# Patient Record
Sex: Male | Born: 1995 | Race: Black or African American | Hispanic: No | Marital: Single | Smoking: Never smoker
Health system: Southern US, Community
[De-identification: ages and names within clinical notes are randomized; demographics above are authoritative.]

---
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
## 2016-04-30 IMAGING — US US SCROTUM
1 series · 13 of 25 positions shown · non-contrast
Comparison: None

CLINICAL DATA: RIGHT testicular pain swelling since 4944 hours

EXAM:
SCROTAL ULTRASOUND
DOPPLER ULTRASOUND OF THE TESTICLES
TECHNIQUE: Complete ultrasound examination of the testicles, epididymis, and
other scrotal structures was performed. Color and spectral Doppler
ultrasound were also utilized to evaluate blood flow to the
testicles.

[Series 1: us scrotum · 0.07mm/px · 13 of 66 slices shown]
[im 1/66]
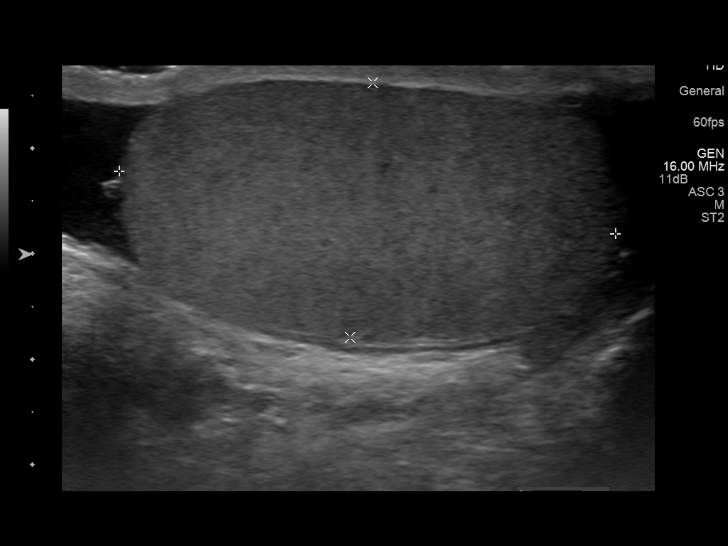
[im 6/66]
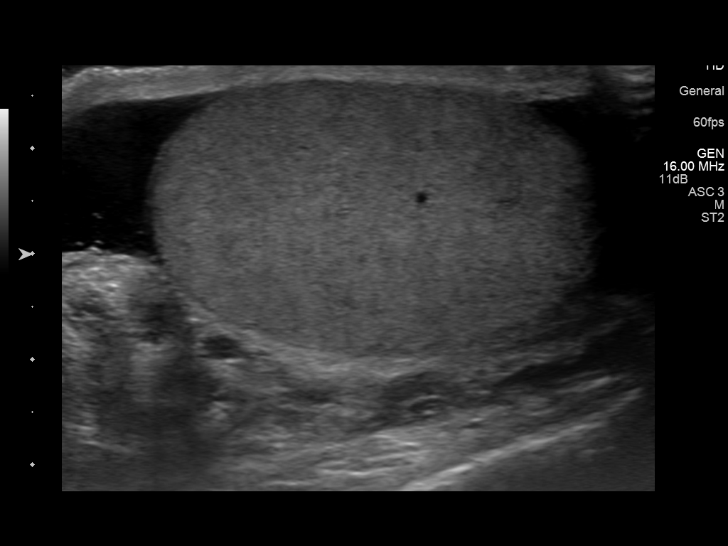
[im 11/66]
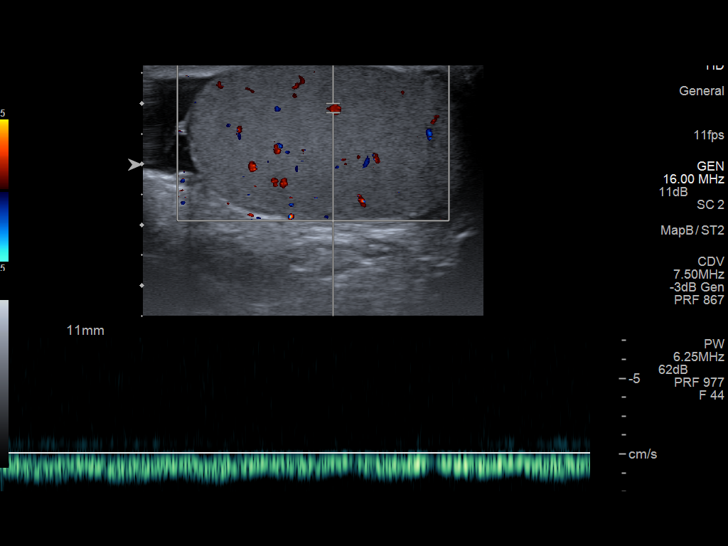
[im 17/66]
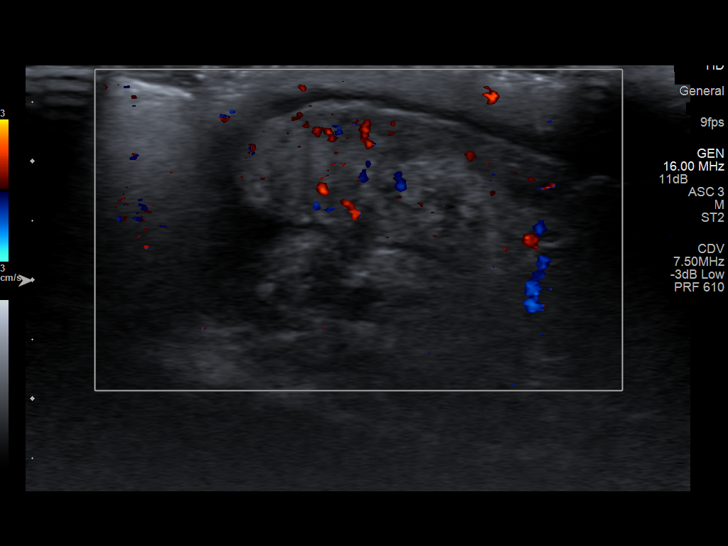
[im 22/66]
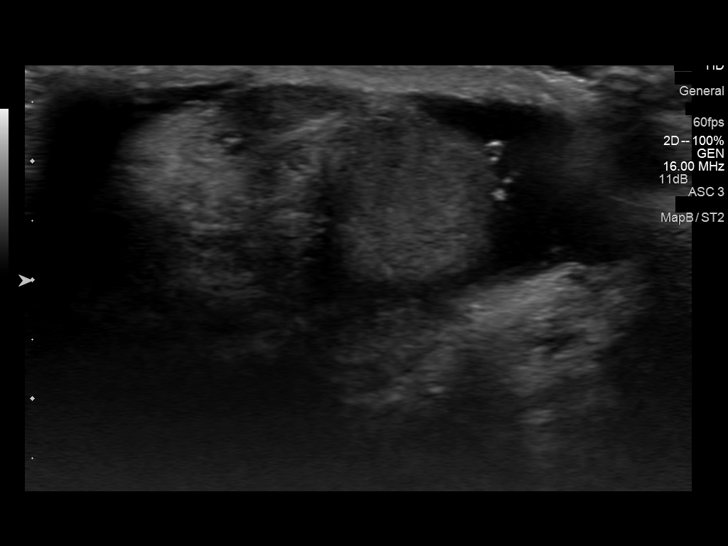
[im 28/66]
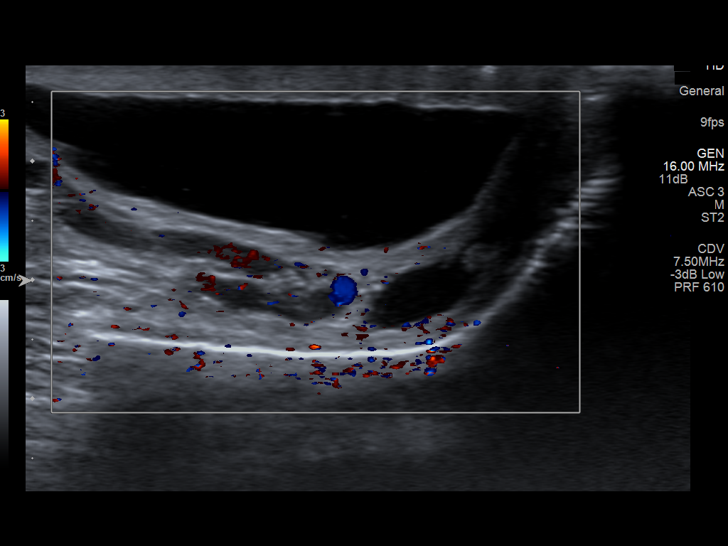
[im 33/66]
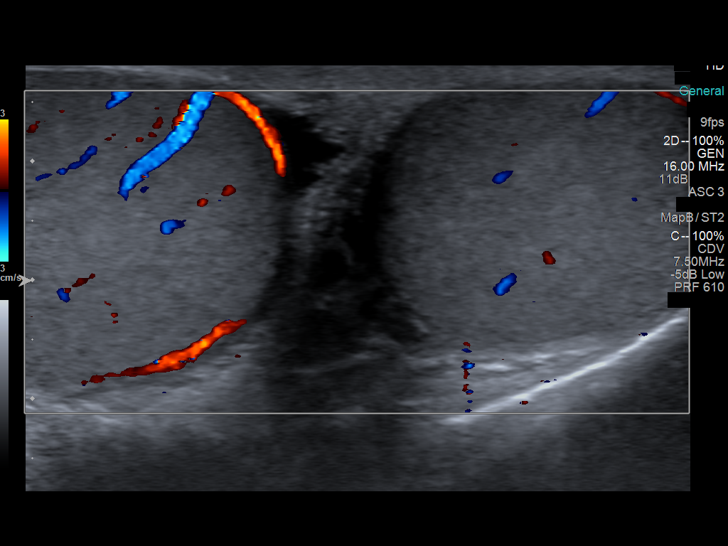
[im 38/66]
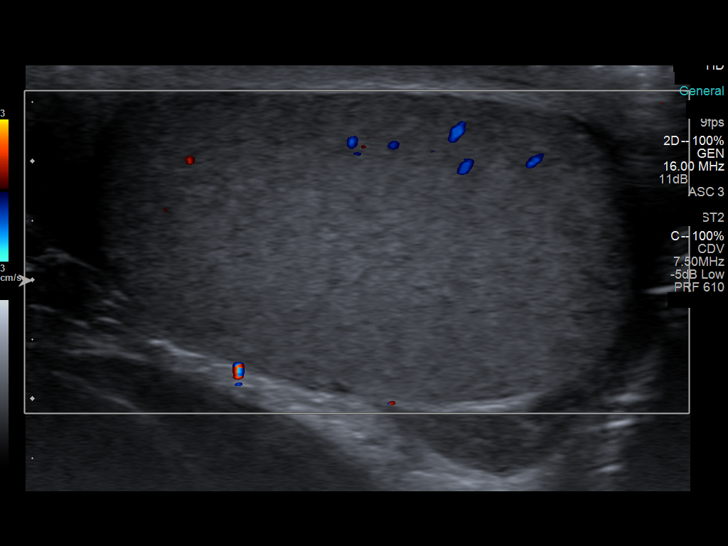
[im 44/66]
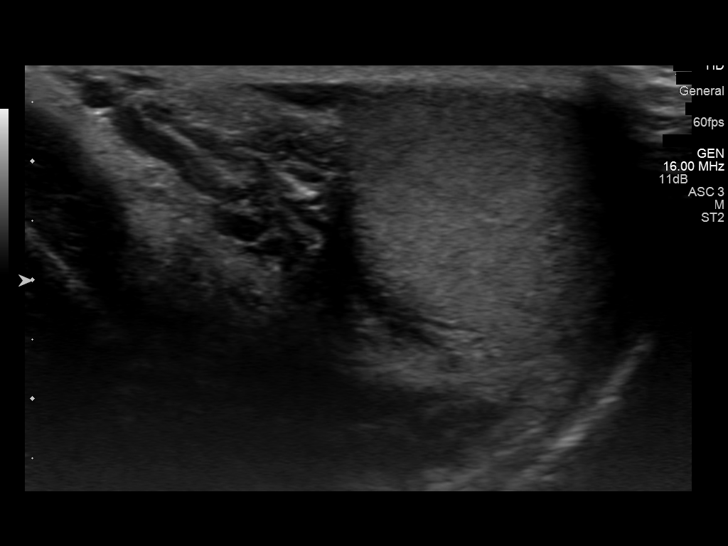
[im 49/66]
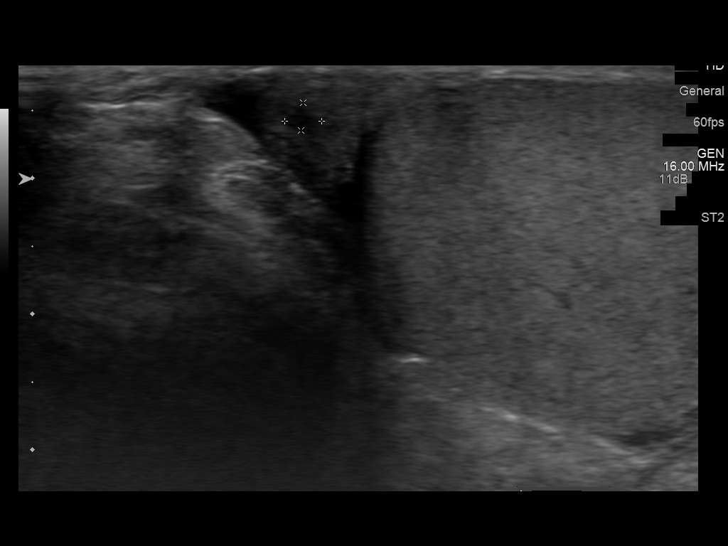
[im 55/66]
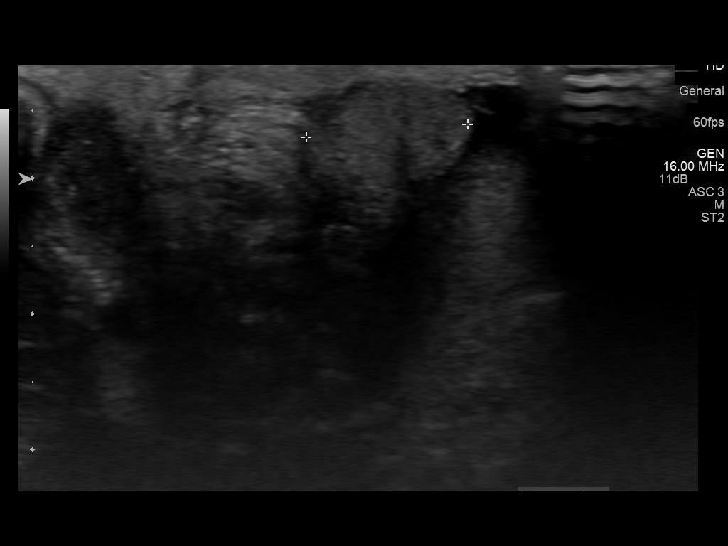
[im 60/66]
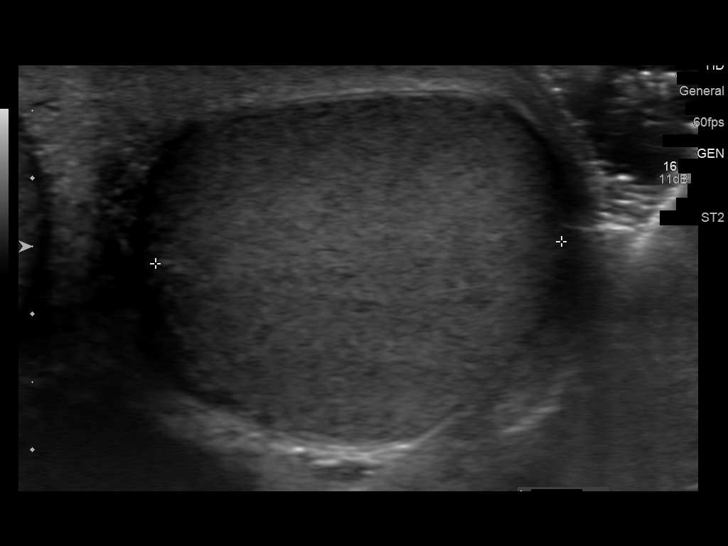
[im 66/66]
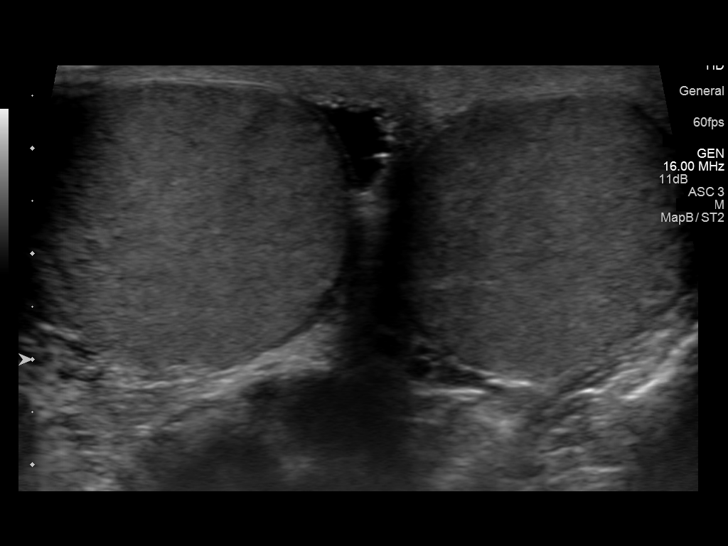

[13 of 25 positions shown; findings below may reference images not displayed]

FINDINGS: Right testicle

Measurements: 4.7 x 2.4 x 3.2 cm. Normal morphology without mass or
calcification. Internal blood flow present on color Doppler imaging,
greater than seen on LEFT.

Left testicle

Measurements: 4.8 x 2.5 x 3.0 cm. Normal morphology without mass or
calcification. Internal blood flow present on color Doppler imaging.

Right epididymis: Mildly hypervascular and slightly heterogeneous
without mass.

Left epididymis:  Tiny cyst at head 3 x 2 x 2 mm.

Hydrocele:  Small BILATERAL hydroceles greater on RIGHT.

Varicocele:  Absent bilaterally

Pulsed Doppler interrogation of both testes demonstrates normal low
resistance arterial and venous waveforms bilaterally.
IMPRESSION: Hypervascularity of RIGHT testis and epididymis versus LEFT
compatible with epididymo-orchitis of the RIGHT testis.

No evidence of testicular mass or torsion.

Small BILATERAL hydroceles greater on RIGHT.

## 2019-02-12 ENCOUNTER — Other Ambulatory Visit: Payer: Self-pay

## 2019-02-12 DIAGNOSIS — Z20822 Contact with and (suspected) exposure to covid-19: Secondary | ICD-10-CM

## 2019-02-13 LAB — NOVEL CORONAVIRUS, NAA: SARS-CoV-2, NAA: NOT DETECTED

## 2020-11-28 ENCOUNTER — Ambulatory Visit
Admission: EM | Admit: 2020-11-28 | Discharge: 2020-11-28 | Disposition: A | Discharge status: Home / Self Care | Payer: Self-pay

## 2020-11-28 ENCOUNTER — Other Ambulatory Visit: Payer: Self-pay

## 2020-11-28 DIAGNOSIS — S09301A Unspecified injury of right middle and inner ear, initial encounter: Secondary | ICD-10-CM

## 2020-11-28 DIAGNOSIS — H7291 Unspecified perforation of tympanic membrane, right ear: Secondary | ICD-10-CM
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
## 2020-11-28 NOTE — Discharge Instructions (Addendum)
Please follow-up with ear, nose, and throat specialist provided contact information for further evaluation and management.

## 2021-04-06 ENCOUNTER — Other Ambulatory Visit: Payer: Self-pay

## 2021-04-06 ENCOUNTER — Ambulatory Visit
Admission: EM | Admit: 2021-04-06 | Discharge: 2021-04-06 | Disposition: A | Discharge status: Home / Self Care | Payer: PRIVATE HEALTH INSURANCE | Attending: Physician Assistant | Admitting: Physician Assistant

## 2021-04-06 DIAGNOSIS — Z113 Encounter for screening for infections with a predominantly sexual mode of transmission: Secondary | ICD-10-CM | POA: Diagnosis present

## 2021-04-06 NOTE — ED Triage Notes (Signed)
Pt presents today for STD testing. Denies penile discharge. No known STD exposures. Pt would like blood work as well.

## 2021-04-06 NOTE — ED Provider Notes (Signed)
[name]    CSN: [id number] Arrival date & time: 04/06/21  1202      History   Chief Complaint Chief Complaint  Patient presents with   std testing    HPI Barry Nixon is a 26 y.o. male.   Patient here today for std screening. He has not had any symptoms. He denies discharge. He has not had any dysuria. He does not report any known std exposures.     History reviewed. No pertinent past medical history.  There are no problems to display for this patient.   History reviewed. No pertinent surgical history.     Home Medications    Prior to Admission medications   Medication Sig Start Date End Date Taking? Authorizing Provider  doxycycline (VIBRAMYCIN) 100 MG capsule Take 1 capsule (100 mg total) by mouth 2 (two) times daily. 10/07/14   Virgel Manifold, MD    Family History History reviewed. No pertinent family history.  Social History Social History   Tobacco Use   Smoking status: Never   Smokeless tobacco: Never  Substance Use Topics   Alcohol use: No   Drug use: No     Allergies   Patient has no known allergies.   Review of Systems Review of Systems  Constitutional:  Negative for chills and fever.  Eyes:  Negative for discharge and redness.  Respiratory:  Negative for shortness of breath.   Gastrointestinal:  Negative for nausea and vomiting.  Genitourinary:  Negative for penile discharge.  Skin:  Positive for color change and wound.  Neurological:  Negative for numbness.    Physical Exam Triage Vital Signs ED Triage Vitals  Enc Vitals Group     BP 04/06/21 1346 139/79     Pulse Rate 04/06/21 1346 85     Resp 04/06/21 1346 18     Temp 04/06/21 1346 98.6 F (37 C)     Temp Source 04/06/21 1346 Oral     SpO2 04/06/21 1346 99 %     Weight --      Height --      Head Circumference --      Peak Flow --      Pain Score 04/06/21 1347 0     Pain Loc --      Pain Edu? --      Excl. in [place]? --    No data found.  Updated  Vital Signs BP 139/79 (BP Location: Right Arm)    Pulse 85    Temp 98.6 F (37 C) (Oral)    Resp 18    SpO2 99%      Physical Exam Vitals and nursing note reviewed.  Constitutional:      General: He is not in acute distress.    Appearance: Normal appearance. He is not ill-appearing.  HENT:     Head: Normocephalic and atraumatic.  Eyes:     Conjunctiva/sclera: Conjunctivae normal.  Cardiovascular:     Rate and Rhythm: Normal rate.  Pulmonary:     Effort: Pulmonary effort is normal.  Neurological:     Mental Status: He is alert.  Psychiatric:        Mood and Affect: Mood normal.        Behavior: Behavior normal.        Thought Content: Thought content normal.     UC Treatments / Results  Labs (all labs ordered are listed, but only abnormal results are displayed) Labs Reviewed  RPR  HEPATITIS PANEL, ACUTE  HIV ANTIBODY (ROUTINE TESTING W REFLEX)  CYTOLOGY, (ORAL, ANAL, URETHRAL) ANCILLARY ONLY    EKG   Radiology No results found.  Procedures Procedures (including critical care time)  Medications Ordered in UC Medications - No data to display  Initial Impression / Assessment and Plan / UC Course  I have reviewed the triage vital signs and the nursing notes.  Pertinent labs & imaging results that were available during my care of the patient were reviewed by me and considered in my medical decision making (see chart for details).    STD screening as requested. Will await results for further recommendation.   Final Clinical Impressions(s) / UC Diagnoses   Final diagnoses:  Screening for STD (sexually transmitted disease)   Discharge Instructions   None    ED Prescriptions   None    PDMP not reviewed this encounter.   Francene Finders, PA-C 04/06/21 1417

## 2021-04-08 LAB — HIV ANTIBODY (ROUTINE TESTING W REFLEX): HIV Screen 4th Generation wRfx: NONREACTIVE

## 2021-04-08 LAB — RPR: RPR Ser Ql: NONREACTIVE

## 2021-04-09 LAB — CYTOLOGY, (ORAL, ANAL, URETHRAL) ANCILLARY ONLY
Chlamydia: POSITIVE — AB
Comment: NEGATIVE
Comment: NEGATIVE
Comment: NORMAL
Neisseria Gonorrhea: NEGATIVE
Trichomonas: POSITIVE — AB

## 2021-04-10 ENCOUNTER — Telehealth (HOSPITAL_COMMUNITY): Payer: Self-pay | Admitting: Emergency Medicine

## 2021-04-10 MED ORDER — METRONIDAZOLE 500 MG PO TABS: 2000.0000 mg | ORAL_TABLET | Freq: Once | ORAL | 0 refills | Status: AC

## 2021-04-10 MED ORDER — DOXYCYCLINE HYCLATE 100 MG PO CAPS: 100.0000 mg | ORAL_CAPSULE | Freq: Two times a day (BID) | ORAL | 0 refills | Status: AC

## 2021-04-24 ENCOUNTER — Ambulatory Visit
Admission: RE | Admit: 2021-04-24 | Discharge: 2021-04-24 | Discharge status: Against Medical Advice | Payer: PRIVATE HEALTH INSURANCE | Source: Ambulatory Visit

## 2021-04-24 ENCOUNTER — Other Ambulatory Visit: Payer: Self-pay

## 2022-03-12 ENCOUNTER — Other Ambulatory Visit: Payer: Self-pay

## 2022-03-12 ENCOUNTER — Emergency Department (HOSPITAL_BASED_OUTPATIENT_CLINIC_OR_DEPARTMENT_OTHER): Payer: PRIVATE HEALTH INSURANCE

## 2022-03-12 ENCOUNTER — Encounter (HOSPITAL_BASED_OUTPATIENT_CLINIC_OR_DEPARTMENT_OTHER): Payer: Self-pay | Admitting: Emergency Medicine

## 2022-03-12 ENCOUNTER — Emergency Department (HOSPITAL_BASED_OUTPATIENT_CLINIC_OR_DEPARTMENT_OTHER)
Admission: EM | Admit: 2022-03-12 | Discharge: 2022-03-12 | Disposition: A | Discharge status: Home / Self Care | Payer: PRIVATE HEALTH INSURANCE | Attending: Emergency Medicine | Admitting: Emergency Medicine

## 2022-03-12 DIAGNOSIS — R1031 Right lower quadrant pain: Secondary | ICD-10-CM | POA: Diagnosis present

## 2022-03-12 DIAGNOSIS — M79651 Pain in right thigh: Secondary | ICD-10-CM | POA: Diagnosis not present

## 2022-03-12 LAB — URINALYSIS, ROUTINE W REFLEX MICROSCOPIC
Bilirubin Urine: NEGATIVE
Glucose, UA: NEGATIVE mg/dL
Hgb urine dipstick: NEGATIVE
Ketones, ur: NEGATIVE mg/dL
Leukocytes,Ua: NEGATIVE
Nitrite: NEGATIVE
Protein, ur: 30 mg/dL — AB
Specific Gravity, Urine: 1.026 (ref 1.005–1.030)
pH: 6.5 (ref 5.0–8.0)

## 2022-03-12 MED ORDER — NAPROXEN 500 MG PO TABS: 500.0000 mg | ORAL_TABLET | Freq: Two times a day (BID) | ORAL | 0 refills | Status: AC
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
## 2022-03-12 NOTE — ED Triage Notes (Signed)
Pain in leg and groin, described as intermittent sharp prickly pain

## 2022-03-12 NOTE — Discharge Instructions (Signed)
Take the medication as prescribed.  Wear loosefitting pants.  Follow-up outpatient  Return for new or worsening symptoms

## 2022-03-15 LAB — GC/CHLAMYDIA PROBE AMP (~~LOC~~) NOT AT ARMC
Chlamydia: NEGATIVE
Comment: NEGATIVE

## 2022-03-16 LAB — GC/CHLAMYDIA PROBE AMP (~~LOC~~) NOT AT ARMC
Comment: NORMAL
Neisseria Gonorrhea: NEGATIVE
